# Patient Record
Sex: Female | Born: 1980 | Race: White | Hispanic: No | Smoking: Current some day smoker
Health system: Southern US, Community
[De-identification: ages and names within clinical notes are randomized; demographics above are authoritative.]

---

## 2017-04-13 ENCOUNTER — Other Ambulatory Visit: Payer: Self-pay

## 2017-04-13 ENCOUNTER — Encounter (HOSPITAL_BASED_OUTPATIENT_CLINIC_OR_DEPARTMENT_OTHER): Payer: Self-pay | Admitting: *Deleted

## 2017-04-13 ENCOUNTER — Emergency Department (HOSPITAL_BASED_OUTPATIENT_CLINIC_OR_DEPARTMENT_OTHER)
Admission: EM | Admit: 2017-04-13 | Discharge: 2017-04-13 | Disposition: A | Discharge status: Home / Self Care | Payer: Self-pay | Attending: Emergency Medicine | Admitting: Emergency Medicine

## 2017-04-13 DIAGNOSIS — M5441 Lumbago with sciatica, right side: Secondary | ICD-10-CM | POA: Insufficient documentation

## 2017-04-13 DIAGNOSIS — F17210 Nicotine dependence, cigarettes, uncomplicated: Secondary | ICD-10-CM | POA: Insufficient documentation

## 2017-04-13 MED ORDER — PREDNISONE 20 MG PO TABS: 40.0000 mg | ORAL_TABLET | Freq: Every day | ORAL | 0 refills | Status: AC

## 2017-04-13 MED ORDER — PREDNISONE 20 MG PO TABS
40.0000 mg | ORAL_TABLET | Freq: Once | ORAL | Status: AC
  Administered 2017-04-13: 40 mg via ORAL
  Filled 2017-04-13: qty 2

## 2017-04-13 NOTE — ED Notes (Signed)
ED Provider at bedside. 

## 2017-04-13 NOTE — ED Triage Notes (Signed)
Lower right back pain with radiation down right leg. Worse pain with movement. States that she was worked up at [facility] 2 days ago and given multiple rx without relief. Ambulatory. Denies urinary symptoms.

## 2017-04-13 NOTE — ED Provider Notes (Signed)
[facility] EMERGENCY DEPARTMENT Provider Note   CSN: [id number] Arrival date & time: 04/13/17  1757     History   Chief Complaint Chief Complaint  Patient presents with  . Back Pain    HPI Stacie Jacobson is a 37 y.o. female.  37 year old female who presents with low back pain.  One week ago, the patient began having pain in her right lower back.  She was evaluated 2 days ago at an outside hospital ER where x-rays were negative.  She was discharged with Robaxin and naproxen which she has been taking with no relief of her symptoms.  Last dose of Naprosyn was earlier this afternoon.  Recently, she has been having pain radiating down her right leg.  Her back pain is worse with certain movements and especially bending over.  No preceding trauma, heavy lifting, or injury.  No fevers, vomiting, flulike symptoms, or IV drug use.  No numbness, leg weakness, saddle anesthesia, or bowel/bladder incontinence.  The history is provided by the patient.  Back Pain      History reviewed. No pertinent past medical history.  There are no active problems to display for this patient.   Past Surgical History:  Procedure Laterality Date  . CESAREAN SECTION       OB History   None      Home Medications    Prior to Admission medications   Medication Sig Start Date End Date Taking? Authorizing Provider  predniSONE (DELTASONE) 20 MG tablet Take 2 tablets (40 mg total) by mouth daily. 04/14/17   Janiylah Hannis, Ambrose Finlandachel Morgan, MD    Family History History reviewed. No pertinent family history.  Social History Social History   Tobacco Use  . Smoking status: Current Some Day Smoker    Types: Cigarettes  . Smokeless tobacco: Never Used  Substance Use Topics  . Alcohol use: Not Currently  . Drug use: Not Currently     Allergies   Codeine   Review of Systems Review of Systems  Musculoskeletal: Positive for back pain.   All other systems reviewed and are negative except that which  was mentioned in HPI   Physical Exam Updated Vital Signs BP 124/82 (BP Location: Right Arm)   Pulse (!) 57   Temp 98.2 F (36.8 C) (Oral)   Resp 17   Ht 5\' 7"  (1.702 m)   Wt 79.4 kg (175 lb)   SpO2 99%   BMI 27.41 kg/m   Physical Exam  Constitutional: She is oriented to person, place, and time. She appears well-developed and well-nourished. No distress.  HENT:  Head: Normocephalic and atraumatic.  Moist mucous membranes  Eyes: Conjunctivae are normal.  Neck: Neck supple.  Cardiovascular: Normal rate, regular rhythm, normal heart sounds and intact distal pulses.  No murmur heard. Pulmonary/Chest: Effort normal and breath sounds normal.  Abdominal: Soft. Bowel sounds are normal. She exhibits no distension. There is no tenderness.  Musculoskeletal: She exhibits no edema.  Mild tenderness of right lower lumbar paraspinal muscles with no midline spinal tenderness  Neurological: She is alert and oriented to person, place, and time. No sensory deficit.  Fluent speech 5/5 strength BLE 2+ b/l patellar DTRs  Skin: Skin is warm and dry.  Psychiatric: She has a normal mood and affect. Judgment normal.  Nursing note and vitals reviewed.    ED Treatments / Results  Labs (all labs ordered are listed, but only abnormal results are displayed) Labs Reviewed - No data to display  EKG None  Radiology No results found.  Procedures Procedures (including critical care time)  Medications Ordered in ED Medications  predniSONE (DELTASONE) tablet 40 mg (40 mg Oral Given 04/13/17 2231)     Initial Impression / Assessment and Plan / ED Course  I have reviewed the triage vital signs and the nursing notes.       I reviewed normal XR L spine from a few days ago.  She was neurovascularly intact on exam with no weakness. Patient demonstrates no lower extremity weakness, saddle anesthesia, bowel or bladder incontinence, or any other neurologic deficits concerning for cauda equina. No  fevers or other infectious symptoms to suggest by the patient's back pain is due to an infection. I have reviewed return precautions, including the development of any of these signs or symptoms, and the patient has voiced understanding. I discussed risks/benefits of steroid burst given her unilateral sx and sciatica sx. I reviewed supportive care instructions, including NSAIDs, early range of motion exercises, and PCP follow-up if symptoms do not improve for referral to physical therapy. Patient voiced understanding and was discharged in satisfactory condition.   Final Clinical Impressions(s) / ED Diagnoses   Final diagnoses:  Acute right-sided low back pain with right-sided sciatica    ED Discharge Orders        Ordered    predniSONE (DELTASONE) 20 MG tablet  Daily     04/13/17 2224       Jeanne Terrance, Ambrose Finland, MD 04/13/17 [phone]

## 2018-01-19 ENCOUNTER — Other Ambulatory Visit: Payer: Self-pay | Admitting: Family Medicine

## 2018-01-19 DIAGNOSIS — N6310 Unspecified lump in the right breast, unspecified quadrant: Secondary | ICD-10-CM

## 2018-01-23 ENCOUNTER — Ambulatory Visit
Admission: RE | Admit: 2018-01-23 | Discharge: 2018-01-23 | Disposition: A | Discharge status: Home / Self Care | Payer: No Typology Code available for payment source | Source: Ambulatory Visit | Attending: Family Medicine | Admitting: Family Medicine

## 2018-01-23 DIAGNOSIS — N6310 Unspecified lump in the right breast, unspecified quadrant: Secondary | ICD-10-CM

## 2019-12-28 IMAGING — MG DIGITAL DIAGNOSTIC BILATERAL MAMMOGRAM WITH TOMO AND CAD
6 of 9 series · 6 of 25 positions shown · non-contrast
Comparison: Baseline mammogram.

CLINICAL DATA: Area of tenderness in the right 11 o'clock breast,
far posterior depth, felt by the patient.

EXAM:
DIGITAL DIAGNOSTIC BILATERAL MAMMOGRAM WITH CAD AND TOMO
ULTRASOUND RIGHT BREAST

[R CC synth-2D]
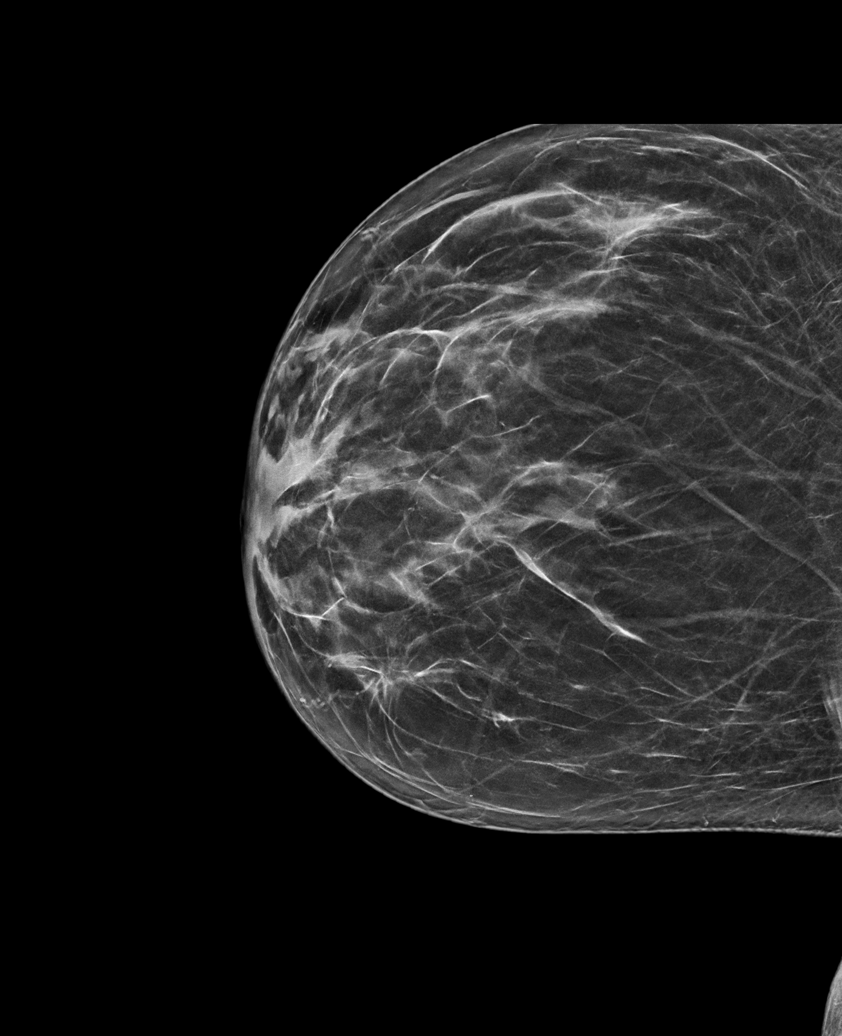

[R MLO synth-2D]
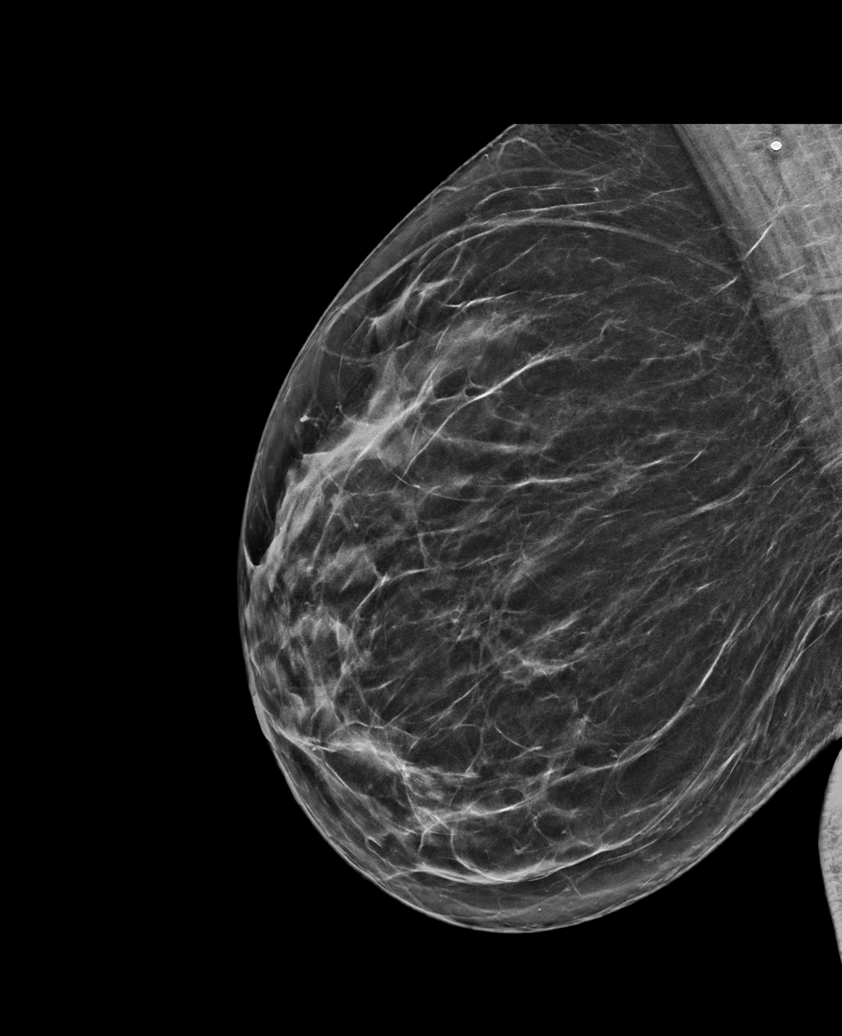

[R TAN synth-2D]
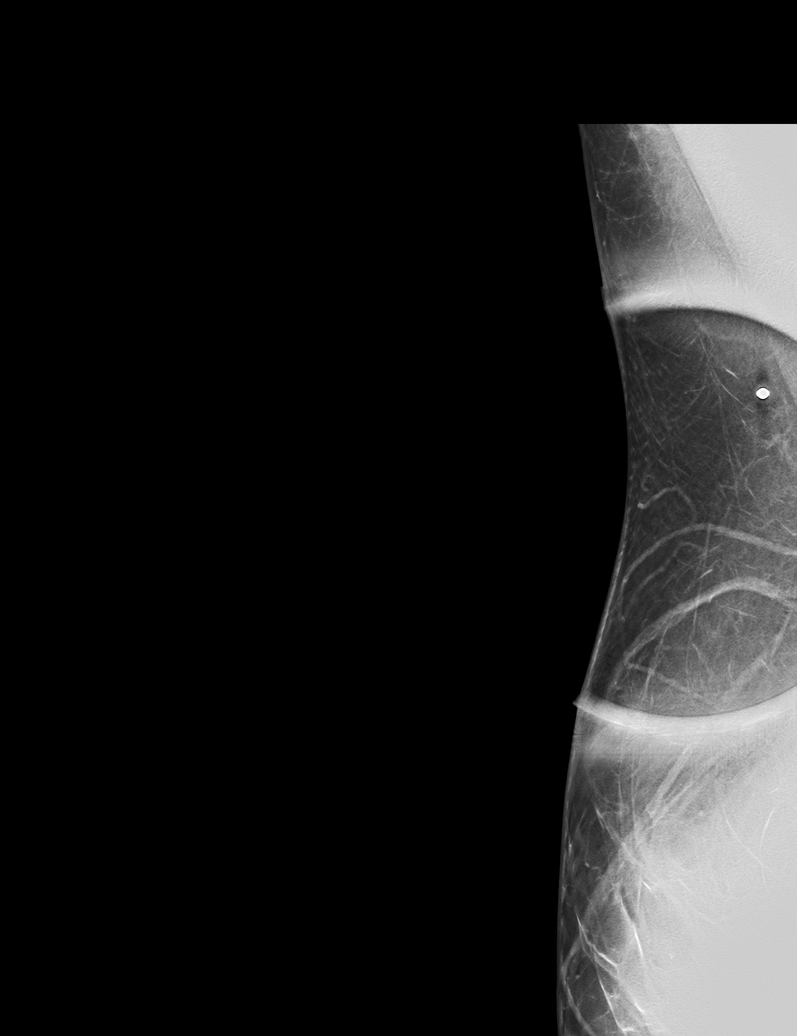

[L MLO synth-2D]
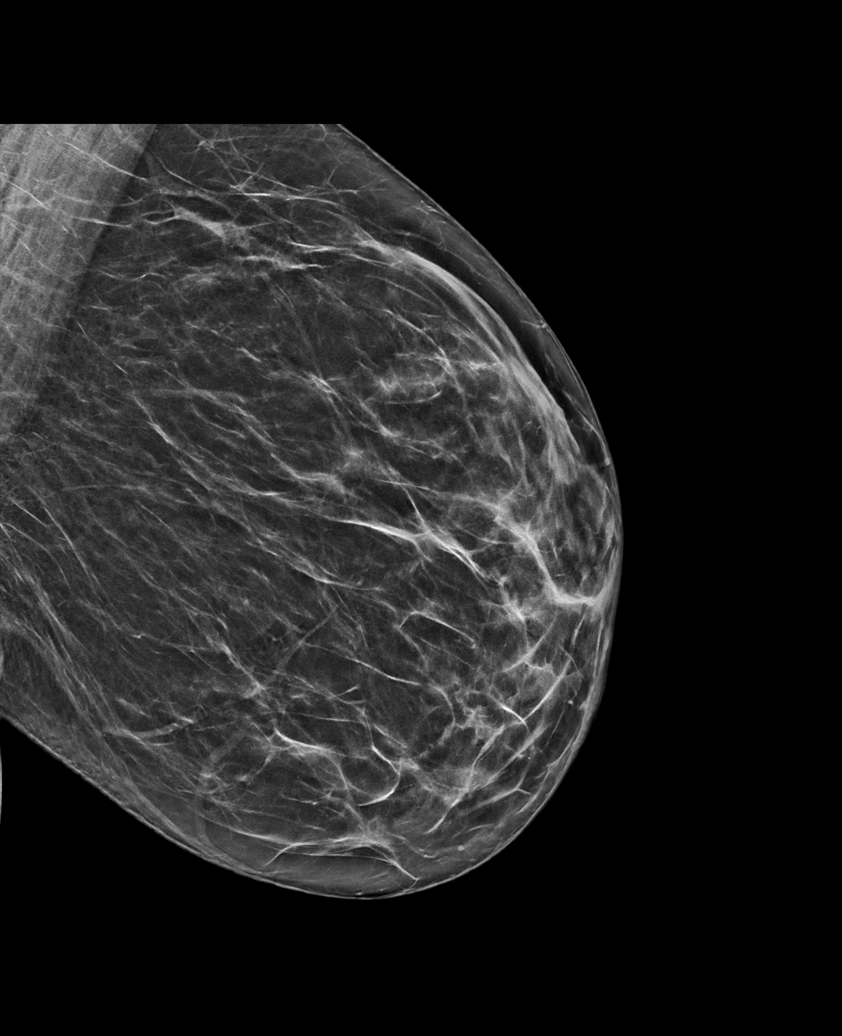

[L CC synth-2D]
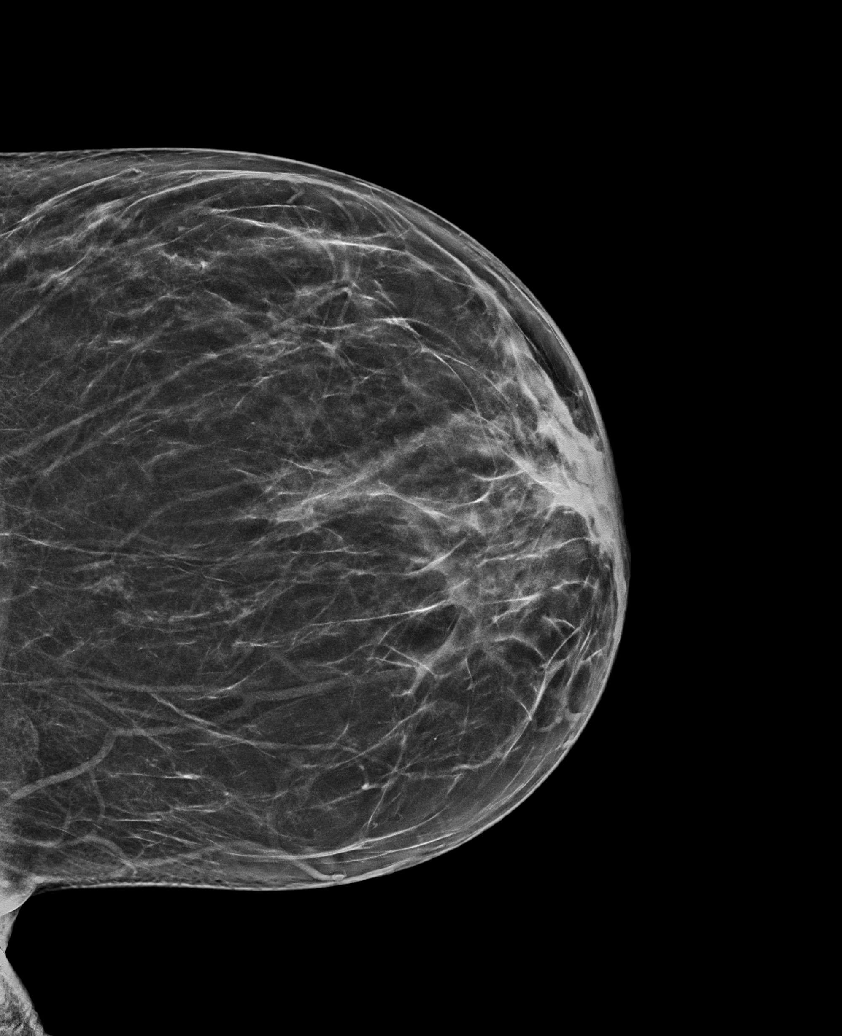

[L MLO tomo · tomo slice 35/68.0]
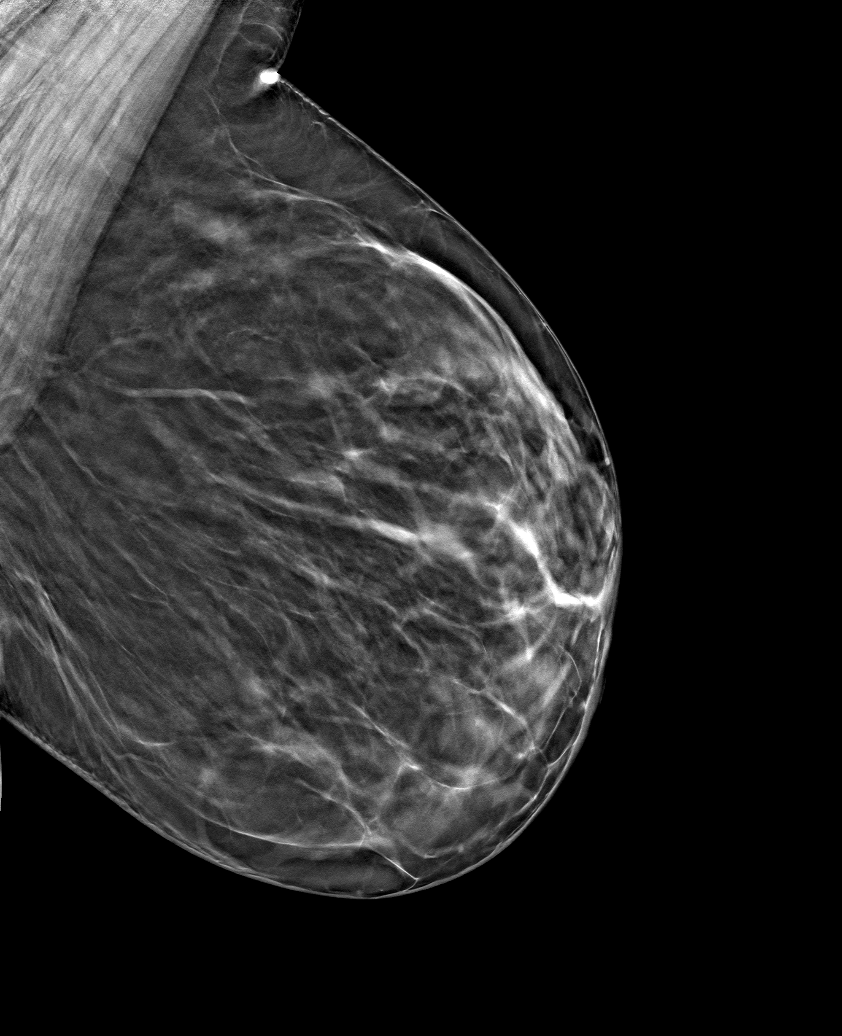

[6 of 25 positions shown; findings below may reference images not displayed]

ACR Breast Density Category b: There are scattered areas of
fibroglandular density.
FINDINGS: Mammographically, there are no suspicious masses, areas of
architectural distortion or microcalcifications in either breast.
The area of palpable concern is visualized on the MLO view and there
is no mammographic correlation to correspond to it.

Mammographic images were processed with CAD.

On physical exam, no suspicious masses are palpated.

Targeted ultrasound is performed, showing no suspicious masses or
shadowing lesions in the area of tenderness in the right breast at
the site of palpable concern.
IMPRESSION: No mammographic or sonographic evidence of malignancy in either
breast.

RECOMMENDATION:
Further management of patient's right breast/chest wall tenderness
should be based on clinical grounds.

Screening mammogram at age 40 unless there are persistent or
intervening clinical concerns. (Code:[id number])

I have discussed the findings and recommendations with the patient.
Results were also provided in writing at the conclusion of the
visit. If applicable, a reminder letter will be sent to the patient
regarding the next appointment.

BI-RADS CATEGORY  1: Negative.
